# Patient Record
Sex: Male | Born: 1995 | Race: White | Hispanic: No | Marital: Single | Smoking: Current every day smoker
Health system: Southern US, Community
[De-identification: ages and names within clinical notes are randomized; demographics above are authoritative.]

---

## 2020-06-17 ENCOUNTER — Encounter (HOSPITAL_BASED_OUTPATIENT_CLINIC_OR_DEPARTMENT_OTHER): Payer: Self-pay | Admitting: Emergency Medicine

## 2020-06-17 ENCOUNTER — Other Ambulatory Visit: Payer: Self-pay

## 2020-06-17 ENCOUNTER — Emergency Department (HOSPITAL_BASED_OUTPATIENT_CLINIC_OR_DEPARTMENT_OTHER): Payer: Self-pay

## 2020-06-17 ENCOUNTER — Emergency Department (HOSPITAL_BASED_OUTPATIENT_CLINIC_OR_DEPARTMENT_OTHER)
Admission: EM | Admit: 2020-06-17 | Discharge: 2020-06-17 | Disposition: A | Discharge status: Home / Self Care | Payer: Self-pay | Attending: Emergency Medicine | Admitting: Emergency Medicine

## 2020-06-17 DIAGNOSIS — I861 Scrotal varices: Secondary | ICD-10-CM | POA: Insufficient documentation

## 2020-06-17 DIAGNOSIS — F17210 Nicotine dependence, cigarettes, uncomplicated: Secondary | ICD-10-CM | POA: Insufficient documentation

## 2020-06-17 DIAGNOSIS — N50811 Right testicular pain: Secondary | ICD-10-CM

## 2020-06-17 DIAGNOSIS — N433 Hydrocele, unspecified: Secondary | ICD-10-CM | POA: Insufficient documentation

## 2020-06-17 DIAGNOSIS — N451 Epididymitis: Secondary | ICD-10-CM | POA: Insufficient documentation

## 2020-06-17 LAB — URINALYSIS, ROUTINE W REFLEX MICROSCOPIC
Bilirubin Urine: NEGATIVE
Glucose, UA: NEGATIVE mg/dL
Hgb urine dipstick: NEGATIVE
Ketones, ur: NEGATIVE mg/dL
Leukocytes,Ua: NEGATIVE
Nitrite: NEGATIVE
Protein, ur: NEGATIVE mg/dL
Specific Gravity, Urine: <1.005 — ABNORMAL LOW (ref 1.005–1.030)
pH: 6 (ref 5.0–8.0)

## 2020-06-17 MED ORDER — DOXYCYCLINE HYCLATE 100 MG PO CAPS: 100.0000 mg | ORAL_CAPSULE | Freq: Two times a day (BID) | ORAL | 0 refills | Status: AC

## 2020-06-17 MED ORDER — DOXYCYCLINE HYCLATE 100 MG PO TABS
100.0000 mg | ORAL_TABLET | Freq: Once | ORAL | Status: AC
  Administered 2020-06-17: 100 mg via ORAL
  Filled 2020-06-17: qty 1

## 2020-06-17 NOTE — ED Provider Notes (Signed)
[facility] EMERGENCY DEPARTMENT Provider Note   CSN: [id number] Arrival date & time: 06/17/20  1761   History Chief Complaint  Patient presents with  . Testicle Pain    Alexander Meyers is a 24 y.o. male.  The history is provided by the patient.  Testicle Pain  He has been having pain in his right testicle for the last 5 days.  He states that he does a lot of work at his father's house and he thinks he may have twisted it somehow.  He has noted that it feels like there is a split in his testicle.  Pain is rated at 5/10.  Is worse with with movement. He has net taken anything for pain.  History reviewed. No pertinent past medical history.  There are no problems to display for this patient.   History reviewed. No pertinent surgical history.     Family History  Problem Relation Age of Onset  . Cancer Mother     Social History   Tobacco Use  . Smoking status: Current Every Day Smoker    Types: Cigarettes  . Smokeless tobacco: Never Used  Vaping Use  . Vaping Use: Some days  . Substances: Nicotine  Substance Use Topics  . Alcohol use: Yes    Comment: social  . Drug use: Yes    Types: Marijuana    Home Medications Prior to Admission medications   Not on File    Allergies    Patient has no known allergies.  Review of Systems   Review of Systems  Genitourinary: Positive for testicular pain.  All other systems reviewed and are negative.   Physical Exam Updated Vital Signs BP 107/64 (BP Location: Left Arm)   Pulse 68   Temp 97.7 F (36.5 C) (Oral)   Resp 16   Ht 5\' 5"  (1.651 m)   Wt 83.9 kg   SpO2 100%   BMI 30.79 kg/m   Physical Exam Vitals and nursing note reviewed.   24 year old male, resting comfortably and in no acute distress. Vital signs are normal. Oxygen saturation is 100%, which is normal. Head is normocephalic and atraumatic. PERRLA, EOMI. Oropharynx is clear. Neck is nontender and supple without adenopathy or JVD. Back is  nontender and there is no CVA tenderness. Lungs are clear without rales, wheezes, or rhonchi. Chest is nontender. Heart has regular rate and rhythm without murmur. Abdomen is soft, flat, nontender without masses or hepatosplenomegaly and peristalsis is normoactive. Genitalia: Uncircumcised penis. Testes descended. Slight enlargement of the right testicle. There is induration and tenderness of the inferior pole of theright testicle. Left testicle is normal. There is no inguinal adenopathy. Extremities have no cyanosis or edema, full range of motion is present. Skin is warm and dry without rash. Neurologic: Mental status is normal, cranial nerves are intact, there are no motor or sensory deficits.  ED Results / Procedures / Treatments   Labs (all labs ordered are listed, but only abnormal results are displayed) Labs Reviewed  URINALYSIS, ROUTINE W REFLEX MICROSCOPIC - Abnormal; Notable for the following components:      Result Value   Specific Gravity, Urine <1.005 (*)    All other components within normal limits    Radiology 30 SCROTUM W/DOPPLER  Result Date: 06/17/2020 CLINICAL DATA:  Right testicular pain for 1 day. EXAM: SCROTAL ULTRASOUND DOPPLER ULTRASOUND OF THE TESTICLES TECHNIQUE: Complete ultrasound examination of the testicles, epididymis, and other scrotal structures was performed. Color and spectral Doppler ultrasound were also  utilized to evaluate blood flow to the testicles. COMPARISON:  None. FINDINGS: Right testicle Measurements: 3.8 x 3 x 3.5 cm. No mass or microlithiasis visualized. Left testicle Measurements: 4.4 x 2.8 x 3.5 cm. No mass or microlithiasis visualized. Right epididymis: The tail of the epididymis is enlarged and hyperemic with increased flow on color flow Doppler imaging. Focal heterogeneous hypoechoic area measuring about 1 cm diameter. Changes likely to represent epididymitis, possibly with small abscess. Left epididymis:  Normal in size and appearance.  Hydrocele:  Bilateral small hydroceles are present. Varicocele:  A small left scrotal varicocele is present. Pulsed Doppler interrogation of both testes demonstrates normal low resistance arterial and venous waveforms bilaterally. Normal and symmetrical flow demonstrated to both testicles on color flow Doppler imaging. IMPRESSION: 1. Normal sonographic appearance of the testicles. No evidence of testicular mass or torsion. 2. Enlarged and hyperemic right epididymal tail suggesting epididymitis. Small focal heterogeneous hypoechoic area may represent focal abscess or phlegmon. 3. Small bilateral hydroceles. 4. Small left scrotal varicocele. Electronically Signed   By: Burman Nieves M.D.   On: 06/17/2020 06:18    Procedures Procedures   Medications Ordered in ED Medications  doxycycline (VIBRA-TABS) tablet 100 mg (has no administration in time range)    ED Course  I have reviewed the triage vital signs and the nursing notes.  Pertinent labs & imaging results that were available during my care of the patient were reviewed by me and considered in my medical decision making (see chart for details).  MDM Rules/Calculators/A&P Right testicular pain. Doubt torsion since it does not involve the entire testicle. Possible epididymitis, possible torsion of the appendix testis. Old records are reviewed, and he has no relevant past visits. He will be sent for testicular ultrasound.  6:34 AM Ultrasound shows epididymitis on the right, incidental finding of small bilateral hydroceles and small left varicocele.  Findings were explained to the patient.  He is discharged with prescription for doxycycline and told to use over-the-counter NSAIDs, referred to urology for follow-up.  Final Clinical Impression(s) / ED Diagnoses Final diagnoses:  Pain in right testicle  Epididymitis, right  Bilateral hydrocele  Left varicocele    Rx / DC Orders ED Discharge Orders    None       Dione Booze,  MD 06/17/20 [phone]

## 2020-06-17 NOTE — ED Triage Notes (Signed)
Pt is c/o right testicle pain  Denies swelling  Pt states it feels like it is being pinched in the middle

## 2020-06-17 NOTE — Discharge Instructions (Addendum)
Use an athletic supporter as needed.  Apply ice packs as needed.  Take ibuprofen or naproxen as needed for pain.  You may add acetaminophen for additional pain relief.

## 2021-01-09 ENCOUNTER — Emergency Department (HOSPITAL_BASED_OUTPATIENT_CLINIC_OR_DEPARTMENT_OTHER): Payer: Commercial Managed Care - PPO

## 2021-01-09 ENCOUNTER — Emergency Department (HOSPITAL_BASED_OUTPATIENT_CLINIC_OR_DEPARTMENT_OTHER)
Admission: EM | Admit: 2021-01-09 | Discharge: 2021-01-09 | Disposition: A | Discharge status: Home / Self Care | Payer: Commercial Managed Care - PPO | Attending: Emergency Medicine | Admitting: Emergency Medicine

## 2021-01-09 ENCOUNTER — Other Ambulatory Visit: Payer: Self-pay

## 2021-01-09 ENCOUNTER — Encounter (HOSPITAL_BASED_OUTPATIENT_CLINIC_OR_DEPARTMENT_OTHER): Payer: Self-pay | Admitting: Emergency Medicine

## 2021-01-09 DIAGNOSIS — J324 Chronic pansinusitis: Secondary | ICD-10-CM | POA: Diagnosis not present

## 2021-01-09 DIAGNOSIS — R519 Headache, unspecified: Secondary | ICD-10-CM

## 2021-01-09 DIAGNOSIS — F17210 Nicotine dependence, cigarettes, uncomplicated: Secondary | ICD-10-CM | POA: Insufficient documentation

## 2021-01-09 MED ORDER — PROCHLORPERAZINE MALEATE 10 MG PO TABS
10.0000 mg | ORAL_TABLET | Freq: Once | ORAL | Status: AC
  Administered 2021-01-09: 10 mg via ORAL
  Filled 2021-01-09: qty 1

## 2021-01-09 MED ORDER — AMOXICILLIN-POT CLAVULANATE 875-125 MG PO TABS: 1.0000 | ORAL_TABLET | Freq: Two times a day (BID) | ORAL | 0 refills | Status: DC

## 2021-01-09 MED ORDER — PREDNISONE 50 MG PO TABS
60.0000 mg | ORAL_TABLET | Freq: Once | ORAL | Status: AC
  Administered 2021-01-09: 60 mg via ORAL
  Filled 2021-01-09: qty 1

## 2021-01-09 MED ORDER — CETIRIZINE HCL 10 MG PO TABS: 10.0000 mg | ORAL_TABLET | Freq: Every day | ORAL | 0 refills | Status: AC

## 2021-01-09 MED ORDER — FLUTICASONE PROPIONATE 50 MCG/ACT NA SUSP: 2.0000 | Freq: Every day | NASAL | 0 refills | Status: AC

## 2021-01-09 MED ORDER — PREDNISONE 20 MG PO TABS: ORAL_TABLET | ORAL | 0 refills | Status: AC

## 2021-01-09 MED ORDER — AMOXICILLIN-POT CLAVULANATE 875-125 MG PO TABS
1.0000 | ORAL_TABLET | Freq: Once | ORAL | Status: AC
  Administered 2021-01-09: 1 via ORAL
  Filled 2021-01-09: qty 1

## 2021-01-09 MED ORDER — ACETAMINOPHEN 500 MG PO TABS
1000.0000 mg | ORAL_TABLET | Freq: Once | ORAL | Status: AC
  Administered 2021-01-09: 1000 mg via ORAL
  Filled 2021-01-09: qty 2

## 2021-01-09 MED ORDER — FLUTICASONE PROPIONATE 50 MCG/ACT NA SUSP: 2.0000 | Freq: Every day | NASAL | 0 refills | Status: DC

## 2021-01-09 MED ORDER — AMOXICILLIN-POT CLAVULANATE 875-125 MG PO TABS: 1.0000 | ORAL_TABLET | Freq: Two times a day (BID) | ORAL | 0 refills | Status: AC

## 2021-01-09 MED ORDER — NAPROXEN 250 MG PO TABS
500.0000 mg | ORAL_TABLET | Freq: Once | ORAL | Status: AC
  Administered 2021-01-09: 500 mg via ORAL
  Filled 2021-01-09: qty 2

## 2021-01-09 NOTE — ED Triage Notes (Signed)
Pt is c/o headache x 2 days  Pt states the pain is behind his left eye

## 2021-01-09 NOTE — ED Provider Notes (Signed)
[facility] EMERGENCY DEPARTMENT Provider Note   CSN: [id number] Arrival date & time: 01/09/21  0132     History Chief Complaint  Patient presents with   Headache    Alexander Meyers is a 25 y.o. male.  The history is provided by the patient.  Headache Location: behind the left eye. Quality:  Dull Radiates to:  Does not radiate Severity currently:  9/10 Severity at highest:  8/10 Onset quality:  Gradual Duration:  3 days Timing:  Constant Progression:  Unchanged Chronicity:  New Context: not activity, not exposure to bright light, not caffeine and not coughing   Relieved by:  Nothing Worsened by:  Nothing Ineffective treatments: a dose of excedrin. Associated symptoms: no back pain, no congestion, no cough, no fever, no myalgias, no nausea, no neck stiffness, no syncope, no URI and no vomiting   Associated symptoms comment:  Pain behind the left eye  Risk factors: lifestyle not sedentary   Patient denies f/c/r.  No weakness no numbness. No changes in vision or speech or cognition.  No neck stiffness.  No congestion or rhinorrhea.      History reviewed. No pertinent past medical history.  There are no problems to display for this patient.   History reviewed. No pertinent surgical history.     Family History  Problem Relation Age of Onset   Cancer Mother    Migraines Mother     Social History   Tobacco Use   Smoking status: Every Day    Packs/day: 1.00    Pack years: 0.00    Types: Cigarettes   Smokeless tobacco: Current  Vaping Use   Vaping Use: Some days   Substances: Nicotine  Substance Use Topics   Alcohol use: Yes    Comment: social   Drug use: Yes    Types: Marijuana    Home Medications Prior to Admission medications   Medication Sig Start Date End Date Taking? Authorizing Provider  amoxicillin-clavulanate (AUGMENTIN) 875-125 MG tablet Take 1 tablet by mouth 2 (two) times daily. One po bid x 7 days 01/09/21  Yes Sebert Stollings, MD   cetirizine (ZYRTEC ALLERGY) 10 MG tablet Take 1 tablet (10 mg total) by mouth daily. 01/09/21  Yes Yahsir Wickens, MD  doxycycline (VIBRAMYCIN) 100 MG capsule Take 1 capsule (100 mg total) by mouth 2 (two) times daily. 06/17/20   Dione Booze, MD    Allergies    Patient has no known allergies.  Review of Systems   Review of Systems  Constitutional:  Negative for fever.  HENT:  Negative for congestion, drooling and rhinorrhea.   Eyes:  Negative for visual disturbance.  Respiratory:  Negative for cough and wheezing.   Cardiovascular:  Negative for leg swelling and syncope.  Gastrointestinal:  Negative for nausea and vomiting.  Musculoskeletal:  Negative for back pain, myalgias and neck stiffness.  Skin:  Negative for rash.  Neurological:  Positive for headaches. Negative for facial asymmetry.  Psychiatric/Behavioral:  Negative for agitation.   All other systems reviewed and are negative.  Physical Exam Updated Vital Signs BP 138/81 (BP Location: Left Arm)   Pulse 76   Temp 98.6 F (37 C) (Oral)   Resp 16   Ht 5\' 5"  (1.651 m)   Wt 84.8 kg   SpO2 99%   BMI 31.12 kg/m   Physical Exam Vitals and nursing note reviewed.  Constitutional:      General: He is not in acute distress.    Appearance: Normal appearance.  HENT:     Head: Normocephalic and atraumatic.     Nose: Nose normal.     Mouth/Throat:     Mouth: Mucous membranes are moist.     Pharynx: Oropharynx is clear.  Eyes:     Extraocular Movements: Extraocular movements intact.     Conjunctiva/sclera: Conjunctivae normal.     Pupils: Pupils are equal, round, and reactive to light.     Comments: No proptosis, intact cognition   Cardiovascular:     Rate and Rhythm: Normal rate and regular rhythm.     Pulses: Normal pulses.     Heart sounds: No murmur heard. Pulmonary:     Effort: Pulmonary effort is normal.  Abdominal:     General: Abdomen is flat. Bowel sounds are normal.     Palpations: Abdomen is soft.      Tenderness: There is no abdominal tenderness. There is no guarding.  Musculoskeletal:        General: Normal range of motion.     Cervical back: Normal range of motion and neck supple. No rigidity.  Skin:    General: Skin is warm and dry.     Capillary Refill: Capillary refill takes less than 2 seconds.  Neurological:     General: No focal deficit present.     Mental Status: He is alert and oriented to person, place, and time.     Cranial Nerves: No cranial nerve deficit.     Deep Tendon Reflexes: Reflexes normal.  Psychiatric:        Mood and Affect: Mood normal.        Behavior: Behavior normal.    ED Results / Procedures / Treatments   Labs (all labs ordered are listed, but only abnormal results are displayed) Labs Reviewed - No data to display  EKG None  Radiology CT Head Wo Contrast  Result Date: 01/09/2021 CLINICAL DATA:  Ocular pain and headache. EXAM: CT HEAD WITHOUT CONTRAST TECHNIQUE: Contiguous axial images were obtained from the base of the skull through the vertex without intravenous contrast. COMPARISON:  None. FINDINGS: Brain: No evidence of acute infarction, hemorrhage, hydrocephalus, extra-axial collection or mass lesion/mass effect. Vascular: No hyperdense vessel or unexpected calcification. Skull: Normal. Negative for fracture or focal lesion. Sinuses/Orbits: There is diffuse opacification of the left maxillary sinus with medial bowing of the medial wall of the maxillary sinus. Opacification of the left ethmoid air cells noted. Complete opacification of the left side of the frontal sinus with mucosal thickening and partial opacification of the right side of the frontal sinus. Dehiscence of the inner table of the left side of frontal sinus is identified, presumably related to chronic sinusitis. Other: None IMPRESSION: 1. No acute intracranial abnormalities. 2. Chronic left-sided paranasal sinus inflammation. 3. Dehiscence of the inner table of the left side of frontal  sinus is identified, presumably related to chronic sinusitis. Recommend outpatient ENT referral for further evaluation. Electronically Signed   By: Signa Kell M.D.   On: 01/09/2021 04:07    Procedures Procedures   Medications Ordered in ED Medications  acetaminophen (TYLENOL) tablet 1,000 mg (1,000 mg Oral Given 01/09/21 0323)  naproxen (NAPROSYN) tablet 500 mg (500 mg Oral Given 01/09/21 0323)  prochlorperazine (COMPAZINE) tablet 10 mg (10 mg Oral Given 01/09/21 2836)    ED Course  I have reviewed the triage vital signs and the nursing notes.  Pertinent labs & imaging results that were available during my care of the patient were reviewed by me and considered in  my medical decision making (see chart for details).   448 Per Dr. Ezzard Standing of ENT, Augmentin, steroid taper and flonase.  If patient prefers to see Dr. Ezzard Standing, please come to office Wednesday at noon.   Contact [facility] unless patient prefers to see Dr. Ezzard Standing.  I have spoken with the patient and he prefers to see Dr. Ezzard Standing and not Marilynne Drivers.    I have messaged Dr. Ezzard Standing to inform him to expect the patient at noon on Wednesday.    Patient does not have proptosis, intact disk margins, intact cognition. No signs of cavernous sinus thrombosis.  No signs of meningitis or encephalitis.  No signs of sepsis. Patient is very well appearing and denies any history of sinus issues in the patient.    I have instructed the patient to take all antibiotics and use all medications as directed and present to Dr. Allene Pyo office at   [facility] was evaluated in Emergency Department on 01/09/2021 for the symptoms described in the history of present illness. He was evaluated in the context of the global COVID-19 pandemic, which necessitated consideration that the patient might be at risk for infection with the SARS-CoV-2 virus that causes COVID-19. Institutional protocols and algorithms that pertain to the evaluation of patients at risk for COVID-19  are in a state of rapid change based on information released by regulatory bodies including the CDC and federal and state organizations. These policies and algorithms were followed during the patient's care in the ED.  Final Clinical Impression(s) / ED Diagnoses Final diagnoses:  Chronic pansinusitis  Headache disorder    Return for intractable cough, coughing up blood, fevers > 100.4 unrelieved by medication, shortness of breath, intractable vomiting, chest pain, shortness of breath, weakness, numbness, changes in speech, facial asymmetry, abdominal pain, passing out, Inability to tolerate liquids or food, cough, altered mental status or any concerns. No signs of systemic illness or infection. The patient is nontoxic-appearing on exam and vital signs are within normal limits. I have reviewed the triage vital signs and the nursing notes. Pertinent labs & imaging results that were available during my care of the patient were reviewed by me and considered in my medical decision making (see chart for details). After history, exam, and medical workup I feel the patient has been appropriately medically screened and is safe for discharge home. Pertinent diagnoses were discussed with the patient. Patient was given return precautions.  Rx / DC Orders ED Discharge Orders          Ordered    cetirizine (ZYRTEC ALLERGY) 10 MG tablet  Daily        01/09/21 0435    fluticasone (FLONASE) 50 MCG/ACT nasal spray  Daily,   Status:  Discontinued        01/09/21 0435    amoxicillin-clavulanate (AUGMENTIN) 875-125 MG tablet  2 times daily        01/09/21 0436    Consult to ear nose and throat       Provider:  (Not yet assigned)   01/09/21 0441             Marja Adderley, MD 01/09/21 5409

## 2022-06-25 IMAGING — CT CT HEAD W/O CM
3 series · 15 of 47 positions shown, 18 images · non-contrast
Comparison: None.

CLINICAL DATA: Ocular pain and headache.

EXAM:
CT HEAD WITHOUT CONTRAST
TECHNIQUE: Contiguous axial images were obtained from the base of the skull
through the vertex without intravenous contrast.

[Series 2: head wo · axial · 0.44mm/px · z∈[-78,+52]mm · 9 of 32 slices shown, 12 images]
[im 3/32  brain]
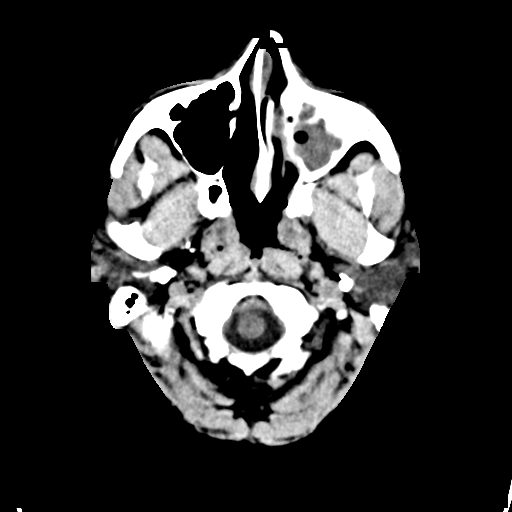
[im 3/32  bone]
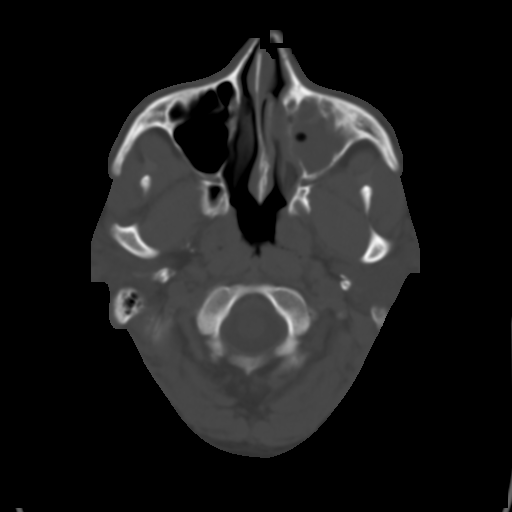
[im 6/32  brain]
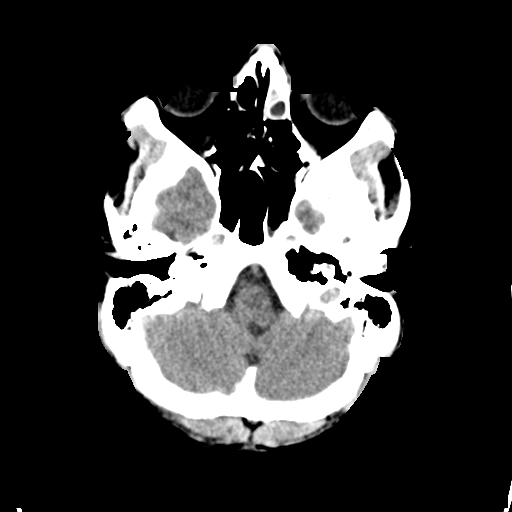
[im 9/32  brain]
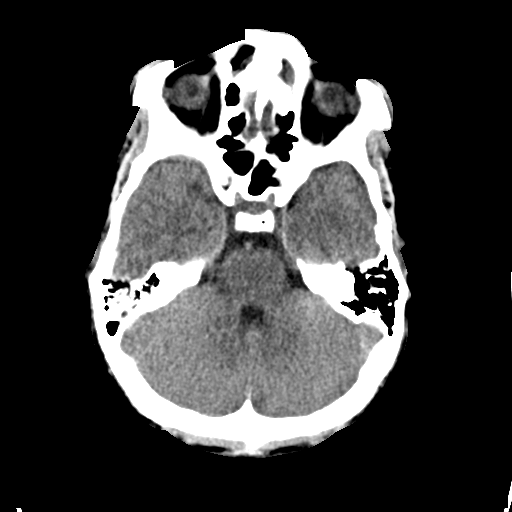
[im 12/32  brain]
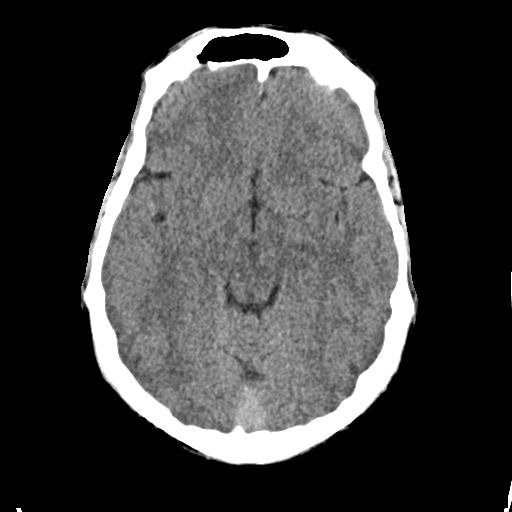
[im 17/32  brain]
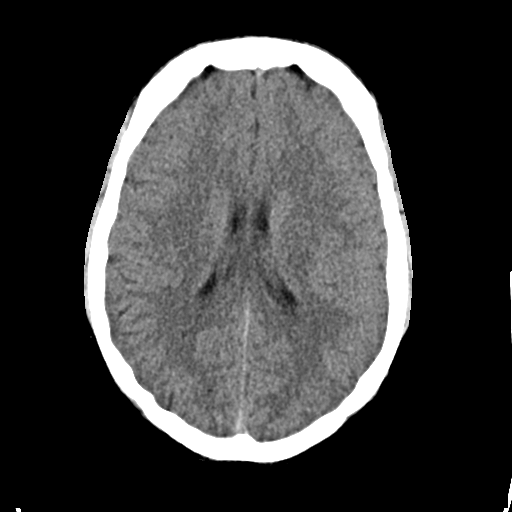
[im 17/32  bone]
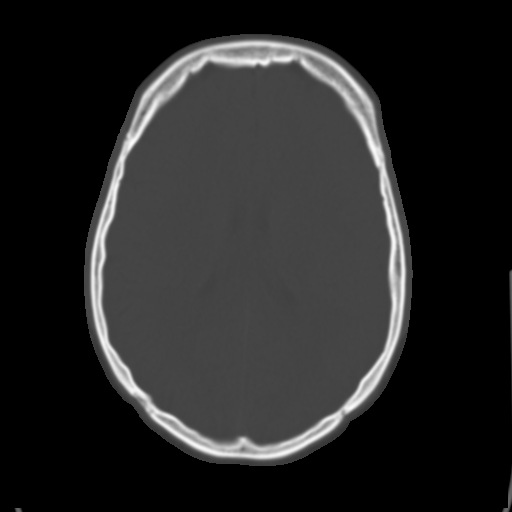
[im 20/32  brain]
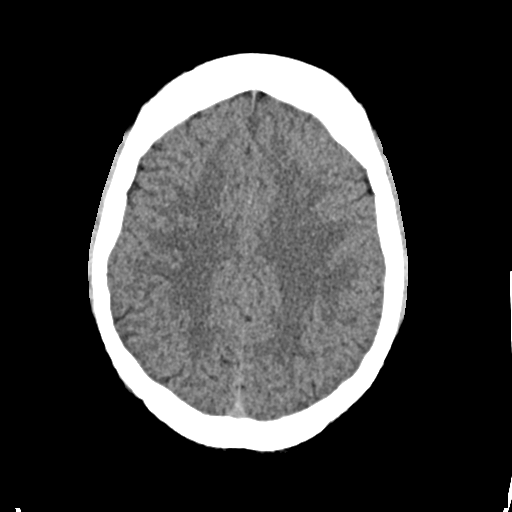
[im 23/32  brain]
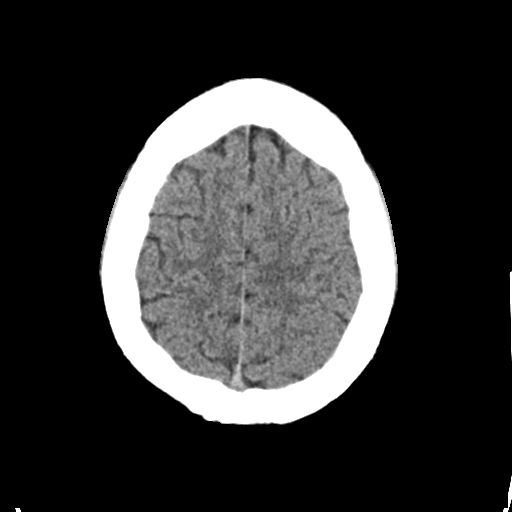
[im 26/32  brain]
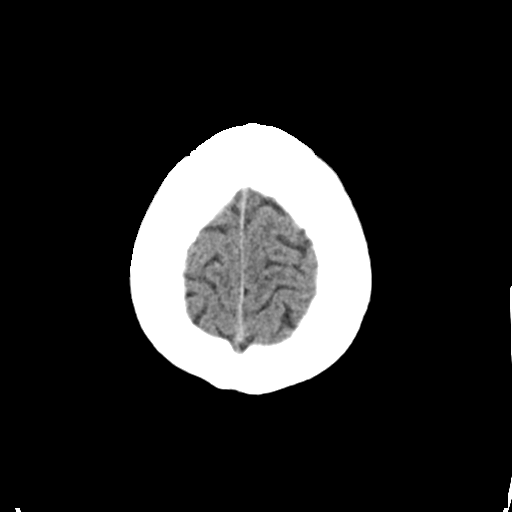
[im 29/32  brain]
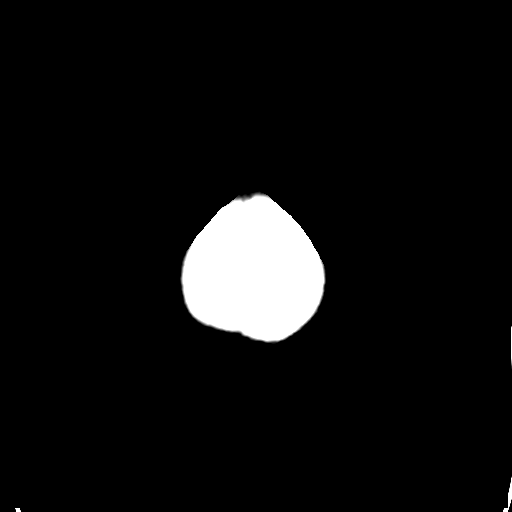
[im 29/32  bone]
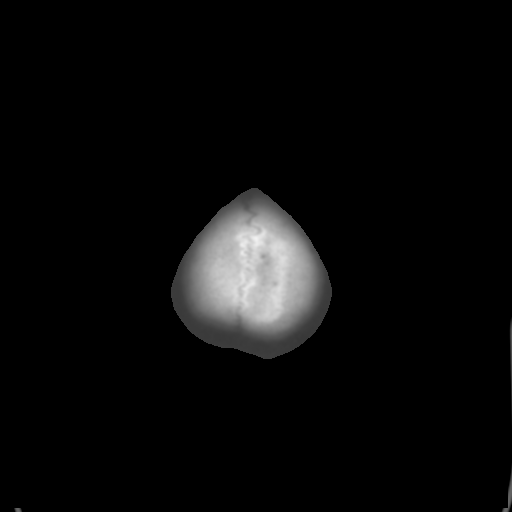

[Series 4: coronal soft · coronal · 0.31mm/px · 3 of 70 slices shown]
[im 24/70  brain]
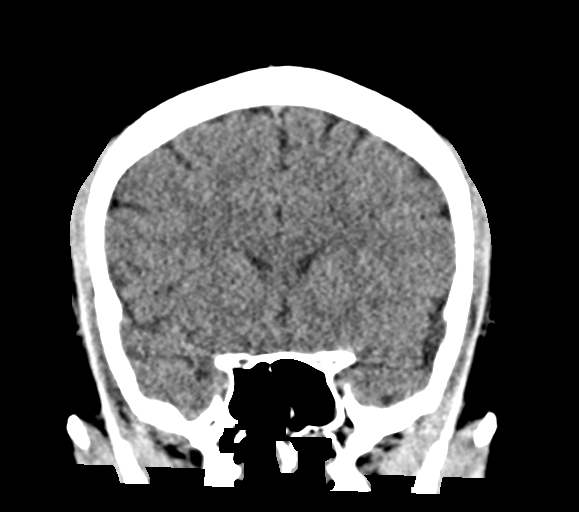
[im 31/70  brain]
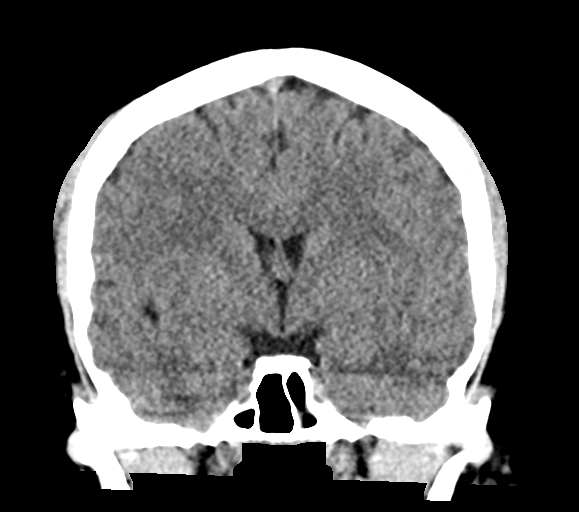
[im 39/70  brain]
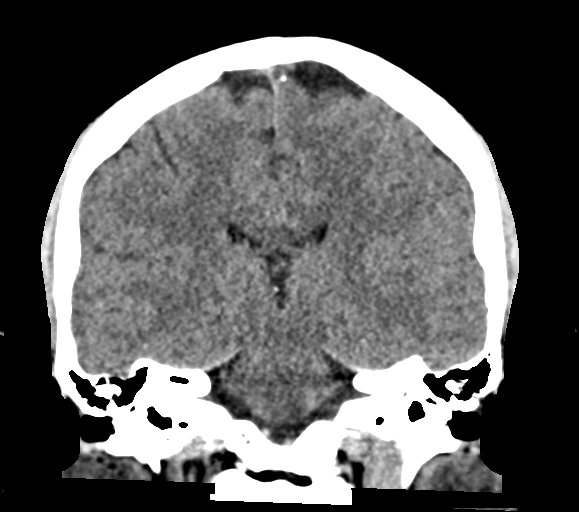

[Series 5: sag soft · sagittal · 0.31mm/px · 3 of 63 slices shown]
[im 21/63  brain]
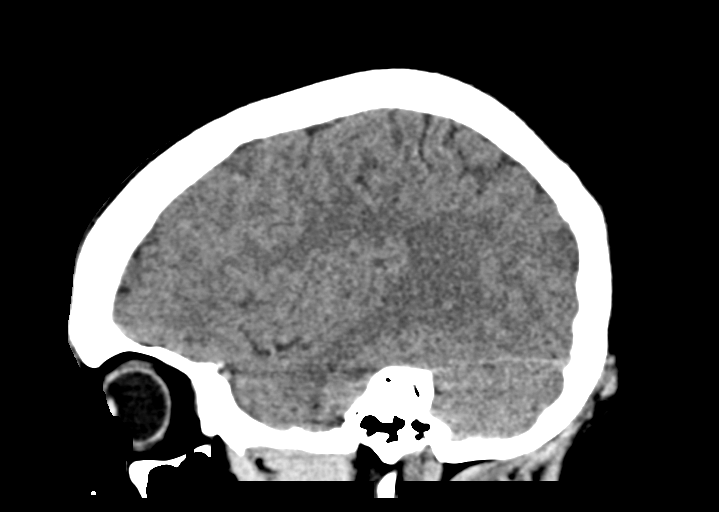
[im 32/63  brain]
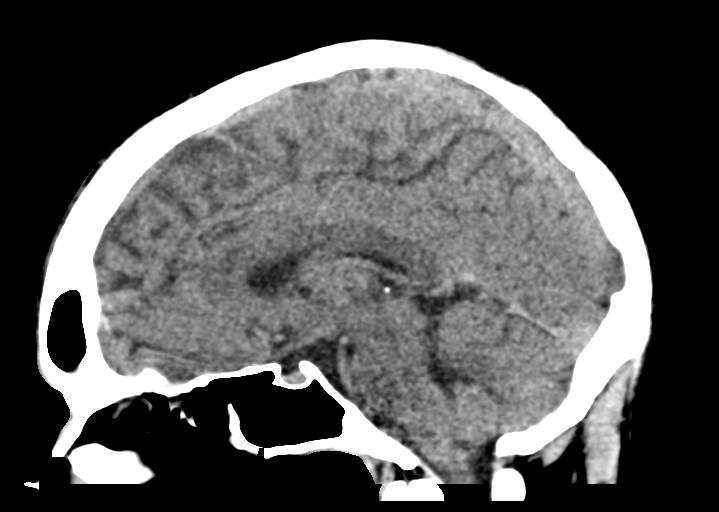
[im 42/63  brain]
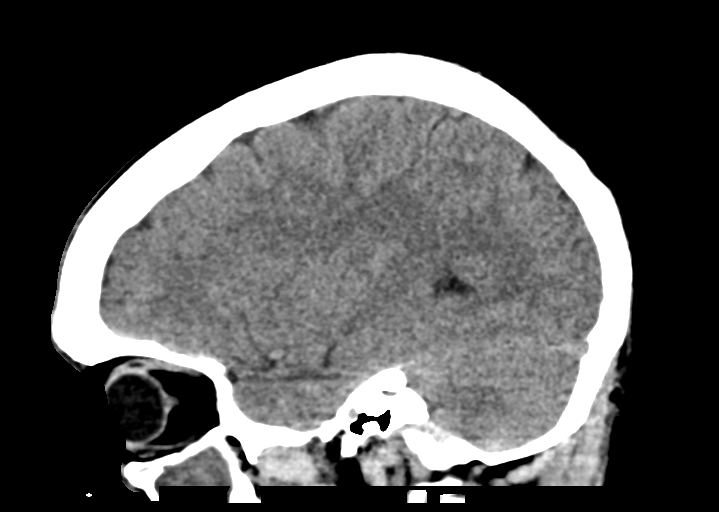

[15 of 47 positions shown; findings below may reference images not displayed]

FINDINGS: Brain: No evidence of acute infarction, hemorrhage, hydrocephalus,
extra-axial collection or mass lesion/mass effect.

Vascular: No hyperdense vessel or unexpected calcification.

Skull: Normal. Negative for fracture or focal lesion.

Sinuses/Orbits: There is diffuse opacification of the left maxillary
sinus with medial bowing of the medial wall of the maxillary sinus.
Opacification of the left ethmoid air cells noted. Complete
opacification of the left side of the frontal sinus with mucosal
thickening and partial opacification of the right side of the
frontal sinus. Dehiscence of the inner table of the left side of
frontal sinus is identified, presumably related to chronic
sinusitis.

Other: None
IMPRESSION: 1. No acute intracranial abnormalities.
2. Chronic left-sided paranasal sinus inflammation.
3. Dehiscence of the inner table of the left side of frontal sinus
is identified, presumably related to chronic sinusitis. Recommend
outpatient ENT referral for further evaluation.
